# Patient Record
Sex: Male | Born: 1970 | Race: White | Hispanic: No | Marital: Single | Smoking: Current every day smoker
Health system: Southern US, Community
[De-identification: ages and names within clinical notes are randomized; demographics above are authoritative.]

## PROBLEM LIST (undated history)

## (undated) DIAGNOSIS — K219 Gastro-esophageal reflux disease without esophagitis: Secondary | ICD-10-CM

## (undated) DIAGNOSIS — T8859XA Other complications of anesthesia, initial encounter: Secondary | ICD-10-CM

## (undated) DIAGNOSIS — Z8489 Family history of other specified conditions: Secondary | ICD-10-CM

## (undated) DIAGNOSIS — R51 Headache: Secondary | ICD-10-CM

## (undated) DIAGNOSIS — R519 Headache, unspecified: Secondary | ICD-10-CM

## (undated) DIAGNOSIS — T4145XA Adverse effect of unspecified anesthetic, initial encounter: Secondary | ICD-10-CM

## (undated) DIAGNOSIS — T148XXA Other injury of unspecified body region, initial encounter: Secondary | ICD-10-CM

## (undated) HISTORY — PX: WISDOM TOOTH EXTRACTION: SHX21

## (undated) SURGERY — Surgical Case
Anesthesia: *Unknown

---

## 2018-02-05 ENCOUNTER — Emergency Department (HOSPITAL_COMMUNITY): Payer: Self-pay

## 2018-02-05 ENCOUNTER — Emergency Department (HOSPITAL_COMMUNITY)
Admission: EM | Admit: 2018-02-05 | Discharge: 2018-02-05 | Disposition: A | Discharge status: Home / Self Care | Payer: Self-pay | Attending: Emergency Medicine | Admitting: Emergency Medicine

## 2018-02-05 ENCOUNTER — Other Ambulatory Visit: Payer: Self-pay

## 2018-02-05 ENCOUNTER — Encounter (HOSPITAL_COMMUNITY): Payer: Self-pay | Admitting: Emergency Medicine

## 2018-02-05 DIAGNOSIS — F17210 Nicotine dependence, cigarettes, uncomplicated: Secondary | ICD-10-CM | POA: Insufficient documentation

## 2018-02-05 DIAGNOSIS — Y999 Unspecified external cause status: Secondary | ICD-10-CM | POA: Insufficient documentation

## 2018-02-05 DIAGNOSIS — Y939 Activity, unspecified: Secondary | ICD-10-CM | POA: Insufficient documentation

## 2018-02-05 DIAGNOSIS — Z23 Encounter for immunization: Secondary | ICD-10-CM | POA: Insufficient documentation

## 2018-02-05 DIAGNOSIS — Y929 Unspecified place or not applicable: Secondary | ICD-10-CM | POA: Insufficient documentation

## 2018-02-05 DIAGNOSIS — S96921A Laceration of unspecified muscle and tendon at ankle and foot level, right foot, initial encounter: Secondary | ICD-10-CM | POA: Insufficient documentation

## 2018-02-05 DIAGNOSIS — S91011A Laceration without foreign body, right ankle, initial encounter: Secondary | ICD-10-CM

## 2018-02-05 DIAGNOSIS — S96821A Laceration of other specified muscles and tendons at ankle and foot level, right foot, initial encounter: Secondary | ICD-10-CM

## 2018-02-05 DIAGNOSIS — W269XXA Contact with unspecified sharp object(s), initial encounter: Secondary | ICD-10-CM | POA: Insufficient documentation

## 2018-02-05 LAB — BASIC METABOLIC PANEL
Anion gap: 11 (ref 5–15)
BUN: 14 mg/dL (ref 6–20)
CO2: 24 mmol/L (ref 22–32)
Calcium: 8.9 mg/dL (ref 8.9–10.3)
Chloride: 100 mmol/L (ref 98–111)
Creatinine, Ser: 0.92 mg/dL (ref 0.61–1.24)
GFR calc Af Amer: >60 mL/min (ref >60)
GFR calc non Af Amer: >60 mL/min (ref >60)
Glucose, Bld: 95 mg/dL (ref 70–99)
Potassium: 4.3 mmol/L (ref 3.5–5.1)
Sodium: 135 mmol/L (ref 135–145)

## 2018-02-05 LAB — CBC
HCT: 39.8 % (ref 39.0–52.0)
Hemoglobin: 13.3 g/dL (ref 13.0–17.0)
MCH: 33.6 pg (ref 26.0–34.0)
MCHC: 33.4 g/dL (ref 30.0–36.0)
MCV: 100.5 fL — ABNORMAL HIGH (ref 80.0–100.0)
Platelets: 190 10*3/uL (ref 150–400)
RBC: 3.96 MIL/uL — ABNORMAL LOW (ref 4.22–5.81)
RDW: 12.2 % (ref 11.5–15.5)
WBC: 11.4 10*3/uL — ABNORMAL HIGH (ref 4.0–10.5)
nRBC: 0 % (ref 0.0–0.2)

## 2018-02-05 MED ORDER — HYDROMORPHONE HCL 1 MG/ML IJ SOLN
1.0000 mg | Freq: Once | INTRAMUSCULAR | Status: AC
  Administered 2018-02-05: 1 mg via INTRAVENOUS
  Filled 2018-02-05: qty 1

## 2018-02-05 MED ORDER — SODIUM CHLORIDE 0.9 % IV BOLUS
1000.0000 mL | Freq: Once | INTRAVENOUS | Status: AC
  Administered 2018-02-05: 1000 mL via INTRAVENOUS

## 2018-02-05 MED ORDER — LIDOCAINE HCL (PF) 1 % IJ SOLN
10.0000 mL | Freq: Once | INTRAMUSCULAR | Status: AC
  Administered 2018-02-05: 10 mL
  Filled 2018-02-05: qty 10

## 2018-02-05 MED ORDER — TETANUS-DIPHTH-ACELL PERTUSSIS 5-2.5-18.5 LF-MCG/0.5 IM SUSP
0.5000 mL | Freq: Once | INTRAMUSCULAR | Status: AC
  Administered 2018-02-05: 0.5 mL via INTRAMUSCULAR
  Filled 2018-02-05: qty 0.5

## 2018-02-05 MED ORDER — HYDROCODONE-ACETAMINOPHEN 5-325 MG PO TABS: 2.0000 | ORAL_TABLET | ORAL | 0 refills | Status: AC | PRN

## 2018-02-05 MED ORDER — FENTANYL CITRATE (PF) 100 MCG/2ML IJ SOLN
50.0000 ug | Freq: Once | INTRAMUSCULAR | Status: AC
  Administered 2018-02-05: 50 ug via INTRAVENOUS
  Filled 2018-02-05: qty 2

## 2018-02-05 NOTE — Discharge Instructions (Addendum)
PLEASE KEEP POST OP SHOE IN PLACE ALL WEEKEND.

## 2018-02-05 NOTE — ED Notes (Signed)
Patient verbalizes understanding of discharge instructions. Opportunity for questioning and answers were provided. Armband removed by staff, pt discharged from ED home via POV with family. 

## 2018-02-05 NOTE — ED Provider Notes (Signed)
[facility] EMERGENCY DEPARTMENT Provider Note   CSN: [id number] Arrival date & time: 02/05/18  1827     History   Chief Complaint Chief Complaint  Patient presents with  . Foot Injury    HPI Corey Leon is a 47 y.o. male.  Patient is a 47 year old male with past medical history significant for none presenting for laceration to right foot.  Patient states that he was chopping wood and accidentally cut the ax into his right foot.  Patient states that he had some mild numbness the plantar surface of his foot however that is somewhat resolving.  Patient is able to move foot except for extension of his big toe.  The history is provided by the patient, the spouse and the EMS personnel.    History reviewed. No pertinent past medical history.  There are no active problems to display for this patient.   History reviewed. No pertinent surgical history.      Home Medications    Prior to Admission medications   Medication Sig Start Date End Date Taking? Authorizing Provider  acetaminophen (TYLENOL) 325 MG tablet Take 650 mg by mouth every 6 (six) hours as needed for mild pain.   Yes [provider]  naproxen sodium (ALEVE) 220 MG tablet Take 220-440 mg by mouth 2 (two) times daily as needed (pain).   Yes [provider]  HYDROcodone-acetaminophen (NORCO/VICODIN) 5-325 MG tablet Take 2 tablets by mouth every 4 (four) hours as needed for up to 5 days for moderate pain or severe pain (1-moderate pain, 2-severe pain). 02/05/18 02/10/18  Joaquin Courts, MD    Family History No family history on file.  Social History Social History   Tobacco Use  . Smoking status: Current Every Day Smoker    Packs/day: 1.00    Types: Cigarettes  . Smokeless tobacco: Never Used  Substance Use Topics  . Alcohol use: Yes    Comment: social  . Drug use: Never     Allergies   Patient has no known allergies.   Review of Systems Review of Systems    Constitutional: Negative for chills and fever.  HENT: Negative for ear pain and sore throat.   Eyes: Negative for pain and visual disturbance.  Respiratory: Negative for cough and shortness of breath.   Cardiovascular: Negative for chest pain and palpitations.  Gastrointestinal: Negative for abdominal pain and vomiting.  Genitourinary: Negative for dysuria and hematuria.  Musculoskeletal: Negative for arthralgias and back pain.  Skin: Positive for wound. Negative for color change and rash.  Neurological: Negative for seizures and syncope.  All other systems reviewed and are negative.    Physical Exam Updated Vital Signs BP 111/66   Pulse 68   Temp 97.9 F (36.6 C) (Oral)   Resp 18   Ht 5\' 10"  (1.778 m)   Wt 83.9 kg   SpO2 95%   BMI 26.54 kg/m   Physical Exam  Constitutional: He appears well-developed and well-nourished.  HENT:  Head: Normocephalic and atraumatic.  Eyes: Conjunctivae are normal.  Neck: Neck supple.  Cardiovascular: Normal rate and regular rhythm.  No murmur heard. Pulmonary/Chest: Effort normal and breath sounds normal. No respiratory distress.  Abdominal: Soft. There is no tenderness.  Musculoskeletal: He exhibits no edema.       Feet:  Large laceration across anterior of right ankle and dorsum of foot.  Patient has gross sensation intact. DP and PT 2+ pulses.  Patient unable to extend big toe.  Laceration  noted for complete transection of extensor hallucis longus tendon  Neurological: He is alert.  Skin: Skin is warm and dry.  Psychiatric: He has a normal mood and affect.  Nursing note and vitals reviewed.    ED Treatments / Results  Labs (all labs ordered are listed, but only abnormal results are displayed) Labs Reviewed  CBC - Abnormal; Notable for the following components:      Result Value   WBC 11.4 (*)    RBC 3.96 (*)    MCV 100.5 (*)    All other components within normal limits  BASIC METABOLIC PANEL    EKG None  Radiology Dg  Ankle Complete Right  Result Date: 02/05/2018 CLINICAL DATA:  Chopping injury with acts laceration. EXAM: RIGHT ANKLE - COMPLETE 3+ VIEW COMPARISON:  None. FINDINGS: Some soft tissue deformity. Bandage artifact. No evidence of fracture. No air/gas seen in any joint. No discernible radiopaque foreign object. IMPRESSION: Soft tissue injury. No evidence of fracture or discernible radiopaque foreign object. Electronically Signed   By: Paulina Fusi M.D.   On: 02/05/2018 19:46   Dg Foot Complete Right  Result Date: 02/05/2018 CLINICAL DATA:  Ax injury to the soft tissues. EXAM: RIGHT FOOT COMPLETE - 3+ VIEW COMPARISON:  None. FINDINGS: Soft tissue abnormality of the dorsal foot. No evidence of fracture, radiopaque foreign object or discernible air/gas in any joint. IMPRESSION: Soft tissue injury as above. Electronically Signed   By: Paulina Fusi M.D.   On: 02/05/2018 19:46    Procedures .Marland KitchenLaceration Repair Date/Time: 02/05/2018 11:09 PM Performed by: Joaquin Courts, MD Authorized by: Azalia Bilis, MD   Consent:    Consent obtained:  Verbal   Consent given by:  Patient   Alternatives discussed:  Delayed treatment and referral Laceration details:    Location:  Foot   Foot location:  Top of R foot   Length (cm):  20 Repair type:    Repair type:  Simple Pre-procedure details:    Preparation:  Patient was prepped and draped in usual sterile fashion and imaging obtained to evaluate for foreign bodies Exploration:    Wound exploration: wound explored through full range of motion     Wound extent: fascia violated, muscle damage and tendon damage     Wound extent: no underlying fracture noted and no vascular damage noted     Tendon damage location:  Lower extremity   Tendon damage extent:  Complete transection   Tendon repair plan:  Refer for evaluation   Contaminated: no   Treatment:    Area cleansed with:  Betadine and saline   Amount of cleaning:  Extensive   Irrigation solution:  Sterile  saline   Irrigation volume:  2L   Irrigation method:  Syringe   Visualized foreign bodies/material removed: no   Skin repair:    Repair method:  Sutures   Suture size:  3-0   Suture material:  Prolene   Suture technique:  Simple interrupted   Number of sutures:  9 Approximation:    Approximation:  Loose Post-procedure details:    Dressing:  Antibiotic ointment, sterile dressing, splint for protection and bulky dressing   Patient tolerance of procedure:  Tolerated well, no immediate complications   (including critical care time)  Medications Ordered in ED Medications  Tdap (BOOSTRIX) injection 0.5 mL (0.5 mLs Intramuscular Given 02/05/18 1854)  HYDROmorphone (DILAUDID) injection 1 mg (1 mg Intravenous Given 02/05/18 1853)  sodium chloride 0.9 % bolus 1,000 mL (0 mLs Intravenous Stopped  02/05/18 2038)  lidocaine (PF) (XYLOCAINE) 1 % injection 10 mL (10 mLs Infiltration Given 02/05/18 2046)  fentaNYL (SUBLIMAZE) injection 50 mcg (50 mcg Intravenous Given 02/05/18 2046)     Initial Impression / Assessment and Plan / ED Course  I have reviewed the triage vital signs and the nursing notes.  Pertinent labs & imaging results that were available during my care of the patient were reviewed by me and considered in my medical decision making (see chart for details).     Patient is a 47 year old healthy male who presents for a laceration on the dorsal of his left foot concerning for a complete transection of his left extensor hallucis longus tendon. X-ray shows no acute fractures, no foreign bodies.  Case discussed with orthopedic surgeon on-call Dr. Eulah PontMurphy, wound will be washed out and closed.  Patient will be sent home with postop shoe and follow-up in clinic on Monday.  Tetanus was updated. Norco prescription provided for pain control. Wound care was discussed. Patient safe for discharge. All questions were answered.   Final Clinical Impressions(s) / ED Diagnoses   Final diagnoses:    Laceration of extensor tendon of right foot, initial encounter    ED Discharge Orders         Ordered    HYDROcodone-acetaminophen (NORCO/VICODIN) 5-325 MG tablet  Every 4 hours PRN     02/05/18 2205           Joaquin CourtsWendel, Czar Ysaguirre K, MD 02/05/18 2310    Azalia Bilisampos, Kevin, MD 02/06/18 [phone]

## 2018-02-05 NOTE — ED Triage Notes (Signed)
[facility]: Patient to ED from home c/o R foot injury - was cutting wood with axe and accidentally caught his R foot on the swing down - went through his boot - approximately 4 inch laceration with tendon exposure to top of R foot. Bleeding controlled upon EMS arrival (patient applied pressure bandage). He endorses some numbness to R foot but pulses equal bilaterally. EMS VS: 117/76, P 70, 98% RA, RR 16. 18g. PIV LAC, given 100cc [place] and 10mg  morphine en route.

## 2018-02-10 ENCOUNTER — Other Ambulatory Visit: Payer: Self-pay

## 2018-02-10 ENCOUNTER — Encounter (HOSPITAL_COMMUNITY): Payer: Self-pay | Admitting: *Deleted

## 2018-02-10 ENCOUNTER — Other Ambulatory Visit: Payer: Self-pay | Admitting: Orthopaedic Surgery

## 2018-02-10 NOTE — Progress Notes (Signed)
Corey HickmanNicole Leon, Surgical Coordinator, for Dr. Susa SimmondsAdair confirmed that pt significant other, Corey GlatterKim Richardson, is listed as a DPR. Kim completed pt SDW-Pre-op call. Selena BattenKim denies that pt C/O SOB and chest pain. Selena BattenKim denies that pt is under the care of a cardiologist. Selena BattenKim denies that pt had a stress test, echo and cardiac cath. Kim made aware to have pt stop taking Aspirin (does not take per Selena BattenKim), vitamins, fish oil and herbal medications. Do not take any NSAIDs ie: Ibuprofen, Advil, Naproxen (Aleve), Motrin, BC and Goody Powder. Kim verbalized understanding of all pre-op instructions.

## 2018-02-11 ENCOUNTER — Ambulatory Visit (HOSPITAL_COMMUNITY): Payer: Self-pay | Admitting: Certified Registered Nurse Anesthetist

## 2018-02-11 ENCOUNTER — Encounter (HOSPITAL_COMMUNITY): Payer: Self-pay

## 2018-02-11 ENCOUNTER — Ambulatory Visit (HOSPITAL_COMMUNITY)
Admission: RE | Admit: 2018-02-11 | Discharge: 2018-02-11 | Disposition: A | Discharge status: Home / Self Care | Payer: Self-pay | Attending: Orthopaedic Surgery | Admitting: Orthopaedic Surgery

## 2018-02-11 ENCOUNTER — Encounter (HOSPITAL_COMMUNITY)
Admission: RE | Disposition: A | Discharge status: Home / Self Care | Payer: Self-pay | Source: Home / Self Care | Attending: Orthopaedic Surgery

## 2018-02-11 DIAGNOSIS — S92141B Displaced dome fracture of right talus, initial encounter for open fracture: Secondary | ICD-10-CM | POA: Insufficient documentation

## 2018-02-11 DIAGNOSIS — S96121A Laceration of muscle and tendon of long extensor muscle of toe at ankle and foot level, right foot, initial encounter: Secondary | ICD-10-CM | POA: Insufficient documentation

## 2018-02-11 DIAGNOSIS — Y929 Unspecified place or not applicable: Secondary | ICD-10-CM | POA: Insufficient documentation

## 2018-02-11 DIAGNOSIS — Z791 Long term (current) use of non-steroidal anti-inflammatories (NSAID): Secondary | ICD-10-CM | POA: Insufficient documentation

## 2018-02-11 DIAGNOSIS — W270XXA Contact with workbench tool, initial encounter: Secondary | ICD-10-CM | POA: Insufficient documentation

## 2018-02-11 DIAGNOSIS — F17210 Nicotine dependence, cigarettes, uncomplicated: Secondary | ICD-10-CM | POA: Insufficient documentation

## 2018-02-11 DIAGNOSIS — K219 Gastro-esophageal reflux disease without esophagitis: Secondary | ICD-10-CM | POA: Insufficient documentation

## 2018-02-11 HISTORY — PX: WOUND EXPLORATION: SHX6188

## 2018-02-11 HISTORY — DX: Family history of other specified conditions: Z84.89

## 2018-02-11 HISTORY — DX: Other injury of unspecified body region, initial encounter: T14.8XXA

## 2018-02-11 HISTORY — PX: TENDON REPAIR: SHX5111

## 2018-02-11 HISTORY — DX: Headache, unspecified: R51.9

## 2018-02-11 HISTORY — DX: Headache: R51

## 2018-02-11 HISTORY — DX: Other complications of anesthesia, initial encounter: T88.59XA

## 2018-02-11 HISTORY — DX: Gastro-esophageal reflux disease without esophagitis: K21.9

## 2018-02-11 HISTORY — PX: INCISION AND DRAINAGE OF WOUND: SHX1803

## 2018-02-11 HISTORY — DX: Adverse effect of unspecified anesthetic, initial encounter: T41.45XA

## 2018-02-11 SURGERY — WOUND EXPLORATION
Anesthesia: General | Site: Leg Lower | Laterality: Right

## 2018-02-11 MED ORDER — HYDROMORPHONE HCL 1 MG/ML IJ SOLN
INTRAMUSCULAR | Status: AC
  Administered 2018-02-11: 0.5 mg via INTRAVENOUS
  Filled 2018-02-11: qty 1

## 2018-02-11 MED ORDER — PROMETHAZINE HCL 25 MG/ML IJ SOLN: 6.2500 mg | INTRAMUSCULAR | Status: DC | PRN

## 2018-02-11 MED ORDER — CEFAZOLIN SODIUM-DEXTROSE 2-3 GM-%(50ML) IV SOLR
INTRAVENOUS | Status: DC | PRN
  Administered 2018-02-11: 2 g via INTRAVENOUS

## 2018-02-11 MED ORDER — MIDAZOLAM HCL 2 MG/2ML IJ SOLN
INTRAMUSCULAR | Status: AC
  Filled 2018-02-11: qty 2

## 2018-02-11 MED ORDER — OXYCODONE HCL 5 MG PO TABS
ORAL_TABLET | ORAL | Status: AC
  Filled 2018-02-11: qty 1

## 2018-02-11 MED ORDER — FENTANYL CITRATE (PF) 100 MCG/2ML IJ SOLN
INTRAMUSCULAR | Status: DC | PRN
  Administered 2018-02-11 (×5): 50 ug via INTRAVENOUS

## 2018-02-11 MED ORDER — PROPOFOL 10 MG/ML IV BOLUS
INTRAVENOUS | Status: AC
  Filled 2018-02-11: qty 20

## 2018-02-11 MED ORDER — HYDROMORPHONE HCL 1 MG/ML IJ SOLN
0.2500 mg | INTRAMUSCULAR | Status: DC | PRN
  Administered 2018-02-11 (×4): 0.5 mg via INTRAVENOUS

## 2018-02-11 MED ORDER — PHENYLEPHRINE HCL 10 MG/ML IJ SOLN
INTRAMUSCULAR | Status: DC | PRN
  Administered 2018-02-11: 80 ug via INTRAVENOUS

## 2018-02-11 MED ORDER — DEXAMETHASONE SODIUM PHOSPHATE 10 MG/ML IJ SOLN
INTRAMUSCULAR | Status: AC
  Filled 2018-02-11: qty 1

## 2018-02-11 MED ORDER — BUPIVACAINE HCL 0.25 % IJ SOLN
INTRAMUSCULAR | Status: DC | PRN
  Administered 2018-02-11: 20 mL

## 2018-02-11 MED ORDER — OXYCODONE HCL 5 MG/5ML PO SOLN: 5.0000 mg | Freq: Once | ORAL | Status: AC | PRN

## 2018-02-11 MED ORDER — PHENYLEPHRINE 40 MCG/ML (10ML) SYRINGE FOR IV PUSH (FOR BLOOD PRESSURE SUPPORT)
PREFILLED_SYRINGE | INTRAVENOUS | Status: AC
  Filled 2018-02-11: qty 20

## 2018-02-11 MED ORDER — MIDAZOLAM HCL 5 MG/5ML IJ SOLN
INTRAMUSCULAR | Status: DC | PRN
  Administered 2018-02-11: 2 mg via INTRAVENOUS

## 2018-02-11 MED ORDER — SODIUM CHLORIDE 0.9 % IR SOLN
Status: DC | PRN
  Administered 2018-02-11 (×2): 3000 mL

## 2018-02-11 MED ORDER — ARTIFICIAL TEARS OPHTHALMIC OINT
TOPICAL_OINTMENT | OPHTHALMIC | Status: AC
  Filled 2018-02-11: qty 3.5

## 2018-02-11 MED ORDER — LIDOCAINE HCL (CARDIAC) PF 100 MG/5ML IV SOSY
PREFILLED_SYRINGE | INTRAVENOUS | Status: DC | PRN
  Administered 2018-02-11: 80 mg via INTRAVENOUS

## 2018-02-11 MED ORDER — OXYCODONE HCL 5 MG PO TABS
5.0000 mg | ORAL_TABLET | Freq: Once | ORAL | Status: AC | PRN
  Administered 2018-02-11: 5 mg via ORAL

## 2018-02-11 MED ORDER — ONDANSETRON HCL 4 MG/2ML IJ SOLN
INTRAMUSCULAR | Status: DC | PRN
  Administered 2018-02-11: 4 mg via INTRAVENOUS

## 2018-02-11 MED ORDER — ONDANSETRON HCL 4 MG/2ML IJ SOLN
INTRAMUSCULAR | Status: AC
  Filled 2018-02-11: qty 2

## 2018-02-11 MED ORDER — BUPIVACAINE HCL (PF) 0.5 % IJ SOLN
INTRAMUSCULAR | Status: AC
  Filled 2018-02-11: qty 30

## 2018-02-11 MED ORDER — LIDOCAINE 2% (20 MG/ML) 5 ML SYRINGE
INTRAMUSCULAR | Status: AC
  Filled 2018-02-11: qty 10

## 2018-02-11 MED ORDER — LACTATED RINGERS IV SOLN
INTRAVENOUS | Status: DC
  Administered 2018-02-11 (×2): via INTRAVENOUS

## 2018-02-11 MED ORDER — FENTANYL CITRATE (PF) 250 MCG/5ML IJ SOLN
INTRAMUSCULAR | Status: AC
  Filled 2018-02-11: qty 5

## 2018-02-11 MED ORDER — PROPOFOL 10 MG/ML IV BOLUS
INTRAVENOUS | Status: DC | PRN
  Administered 2018-02-11: 200 mg via INTRAVENOUS

## 2018-02-11 MED ORDER — SODIUM CHLORIDE 0.9 % IR SOLN
Status: DC | PRN
  Administered 2018-02-11: 1

## 2018-02-11 SURGICAL SUPPLY — 55 items
BANDAGE ACE 4X5 VEL STRL LF (GAUZE/BANDAGES/DRESSINGS) IMPLANT
BANDAGE ACE 6X5 VEL STRL LF (GAUZE/BANDAGES/DRESSINGS) IMPLANT
BANDAGE ELASTIC 4 VELCRO ST LF (GAUZE/BANDAGES/DRESSINGS) ×3 IMPLANT
BANDAGE ELASTIC 6 VELCRO ST LF (GAUZE/BANDAGES/DRESSINGS) ×3 IMPLANT
BNDG GAUZE ELAST 4 BULKY (GAUZE/BANDAGES/DRESSINGS) IMPLANT
CONT SPEC 4OZ CLIKSEAL STRL BL (MISCELLANEOUS) IMPLANT
COVER SURGICAL LIGHT HANDLE (MISCELLANEOUS) ×3 IMPLANT
COVER WAND RF STERILE (DRAPES) ×3 IMPLANT
CUFF TOURN SGL LL 12 NO SLV (MISCELLANEOUS) IMPLANT
CUFF TOURNIQUET SINGLE 34IN LL (TOURNIQUET CUFF) IMPLANT
DRAPE U-SHAPE 47X51 STRL (DRAPES) ×3 IMPLANT
DRSG PAD ABDOMINAL 8X10 ST (GAUZE/BANDAGES/DRESSINGS) IMPLANT
DURAPREP 26ML APPLICATOR (WOUND CARE) IMPLANT
ELECT REM PT RETURN 9FT ADLT (ELECTROSURGICAL) ×3
ELECTRODE REM PT RTRN 9FT ADLT (ELECTROSURGICAL) ×1 IMPLANT
GAUZE SPONGE 4X4 12PLY STRL (GAUZE/BANDAGES/DRESSINGS) IMPLANT
GAUZE SPONGE 4X4 12PLY STRL LF (GAUZE/BANDAGES/DRESSINGS) ×3 IMPLANT
GAUZE XEROFORM 1X8 LF (GAUZE/BANDAGES/DRESSINGS) ×3 IMPLANT
GLOVE BIOGEL PI IND STRL 8 (GLOVE) ×2 IMPLANT
GLOVE BIOGEL PI INDICATOR 8 (GLOVE) ×4
GLOVE ECLIPSE 8.0 STRL XLNG CF (GLOVE) IMPLANT
GOWN STRL REUS W/ TWL LRG LVL3 (GOWN DISPOSABLE) ×1 IMPLANT
GOWN STRL REUS W/ TWL XL LVL3 (GOWN DISPOSABLE) ×1 IMPLANT
GOWN STRL REUS W/TWL LRG LVL3 (GOWN DISPOSABLE) ×2
GOWN STRL REUS W/TWL XL LVL3 (GOWN DISPOSABLE) ×2
HANDPIECE INTERPULSE COAX TIP (DISPOSABLE)
IRRIGAT CYSTOTOME (MISCELLANEOUS) ×3 IMPLANT
KIT BASIN OR (CUSTOM PROCEDURE TRAY) ×3 IMPLANT
KIT TURNOVER KIT B (KITS) ×3 IMPLANT
MANIFOLD NEPTUNE II (INSTRUMENTS) ×3 IMPLANT
NEEDLE HYPO 25GX1X1/2 BEV (NEEDLE) ×3 IMPLANT
NS IRRIG 1000ML POUR BTL (IV SOLUTION) ×3 IMPLANT
PACK ORTHO EXTREMITY (CUSTOM PROCEDURE TRAY) ×3 IMPLANT
PAD ARMBOARD 7.5X6 YLW CONV (MISCELLANEOUS) ×3 IMPLANT
PAD CAST 4YDX4 CTTN HI CHSV (CAST SUPPLIES) ×1 IMPLANT
PADDING CAST COTTON 4X4 STRL (CAST SUPPLIES) ×2
PADDING CAST COTTON 6X4 STRL (CAST SUPPLIES) IMPLANT
SET HNDPC FAN SPRY TIP SCT (DISPOSABLE) IMPLANT
SOLUTION BETADINE 4OZ (MISCELLANEOUS) ×3 IMPLANT
SPONGE LAP 18X18 X RAY DECT (DISPOSABLE) IMPLANT
SUT 0 FIBERLOOP 38 BLUE TPR ND (SUTURE) ×3
SUT ETHILON 2 0 FS 18 (SUTURE) IMPLANT
SUT ETHILON 3 0 PS 1 (SUTURE) ×3 IMPLANT
SUT MON AB 3-0 SH 27 (SUTURE) ×2
SUT MON AB 3-0 SH27 (SUTURE) ×1 IMPLANT
SUT PDS AB 2-0 CT1 27 (SUTURE) ×3 IMPLANT
SUTURE 0 FIBERLP 38 BLU TPR ND (SUTURE) ×1 IMPLANT
SWAB CULTURE ESWAB REG 1ML (MISCELLANEOUS) IMPLANT
SYR CONTROL 10ML LL (SYRINGE) ×3 IMPLANT
TOWEL OR 17X24 6PK STRL BLUE (TOWEL DISPOSABLE) IMPLANT
TOWEL OR 17X26 10 PK STRL BLUE (TOWEL DISPOSABLE) ×3 IMPLANT
TUBE CONNECTING 12'X1/4 (SUCTIONS) ×1
TUBE CONNECTING 12X1/4 (SUCTIONS) ×2 IMPLANT
UNDERPAD 30X30 (UNDERPADS AND DIAPERS) ×3 IMPLANT
YANKAUER SUCT BULB TIP NO VENT (SUCTIONS) ×3 IMPLANT

## 2018-02-11 NOTE — Discharge Instructions (Signed)
DR. Susa SimmondsADAIR [facility] POST-OP INSTRUCTIONS   Pain Management   1. Keep your foot elevated above heart level.  Make sure that your heel hangs free ('floats'). 2. Keep boot in place at all times.  3. Take all prescribed medication as directed. 4. If taking narcotic pain medication you may want to use an over-the-counter stool softener to avoid constipation. 5. You may take over-the-counter NSAIDs (ibuprofen, naproxen, etc.) as well as over-the-counter acetaminophen as directed on the packaging as a supplement for your pain and may also use it to wean away from the prescription medication.  Activity  ? Non-weightbearing   First Postoperative Visit 1. Your first postop visit will be at least 2 weeks after surgery.  This should be scheduled when you schedule surgery. 2. If you do not have a postoperative visit scheduled please call [phone] to schedule an appointment. 3. At the appointment your incision will be evaluated for suture removal, x-rays will be obtained if necessary.  General Instructions 1. Swelling is very common after foot and ankle surgery.  It often takes 3 months for the foot and ankle to begin to feel comfortable.  Some amount of swelling will persist for 6-12 months. 2. DO NOT change the dressing.  If there is a problem with the dressing (too tight, loose, gets wet, etc.) please contact Dr. Donnie MesaAdair's office. 3. DO NOT get the dressing wet.  For showers you can use an over-the-counter cast cover or wrap a washcloth around the top of your dressing and then cover it with a plastic bag and tape it to your leg. 4. DO NOT soak the incision (no tubs, pools, bath, etc.) until you have approval from Dr. Susa SimmondsAdair.  Contact Dr. Garret ReddishAdairs office or go to Emergency Room if: 1. Temperature above 101 F. 2. Increasing pain that is unresponsive to pain medication or elevation 3. Excessive redness or swelling in your foot 4. Dressing problems - excessive bloody drainage, looseness  or tightness, or if dressing gets wet 5. Develop pain, swelling, warmth, or discoloration of your calf

## 2018-02-11 NOTE — Anesthesia Preprocedure Evaluation (Signed)
Anesthesia Evaluation  Patient identified by MRN, date of birth, ID band Patient awake    Reviewed: Allergy & Precautions, NPO status , Patient's Chart, lab work & pertinent test results  Airway Mallampati: II  TM Distance: >3 FB Neck ROM: Full    Dental no notable dental hx.    Pulmonary Current Smoker,    Pulmonary exam normal breath sounds clear to auscultation       Cardiovascular negative cardio ROS Normal cardiovascular exam Rhythm:Regular Rate:Normal     Neuro/Psych  Headaches, negative psych ROS   GI/Hepatic negative GI ROS, Neg liver ROS,   Endo/Other  negative endocrine ROS  Renal/GU negative Renal ROS     Musculoskeletal negative musculoskeletal ROS (+)   Abdominal   Peds  Hematology negative hematology ROS (+)   Anesthesia Other Findings  RIGHT FOOT TRAUMATIC EXTENSOR HALLUCUS LONGUS LACERATION  Reproductive/Obstetrics                             Anesthesia Physical Anesthesia Plan  ASA: II  Anesthesia Plan: General   Post-op Pain Management:    Induction: Intravenous  PONV Risk Score and Plan: 1 and Ondansetron, Dexamethasone, Midazolam and Treatment may vary due to age or medical condition  Airway Management Planned: LMA  Additional Equipment:   Intra-op Plan:   Post-operative Plan: Extubation in OR  Informed Consent: I have reviewed the patients History and Physical, chart, labs and discussed the procedure including the risks, benefits and alternatives for the proposed anesthesia with the patient or authorized representative who has indicated his/her understanding and acceptance.   Dental advisory given  Plan Discussed with: CRNA  Anesthesia Plan Comments:         Anesthesia Quick Evaluation

## 2018-02-11 NOTE — Anesthesia Postprocedure Evaluation (Signed)
Anesthesia Post Note  Patient: Corey Leon  Procedure(s) Performed: RIGHT FOOT WOUND EXPLORATION (Right Leg Lower) TENDON REPAIR (Right Leg Lower) IRRIGATION AND DEBRIDEMENT WOUND; IRRIGATION AND DEBRIDEMENT OF SITE OPEN FRACTURE; IRRIGATION AND DEBRIDEMENT OF ANKLE ARTHROTOMY; SECONDARY WOUND CLOSURE 7.5 CENTIMETERS; REPAIR OF EXTENSOR RETINACULUM (Right Leg Lower)     Patient location during evaluation: PACU Anesthesia Type: General Level of consciousness: awake and alert Pain management: pain level controlled Vital Signs Assessment: post-procedure vital signs reviewed and stable Respiratory status: spontaneous breathing, nonlabored ventilation, respiratory function stable and patient connected to nasal cannula oxygen Cardiovascular status: blood pressure returned to baseline and stable Postop Assessment: no apparent nausea or vomiting Anesthetic complications: no    Last Vitals:  Vitals:   02/11/18 1400 02/11/18 1412  BP: (!) 124/98 (!) 143/88  Pulse: 62 (!) 59  Resp: 17   Temp: 36.6 C   SpO2: 99% 99%    Last Pain:  Vitals:   02/11/18 1400  TempSrc:   PainSc: 4                  Ryan P Ellender

## 2018-02-11 NOTE — H&P (Signed)
Corey FreshwaterVincent Leon is an 47 y.o. male.   Chief Complaint: Right anterior ankle laceration and extensor pollicis longus laceration HPI: Corey DoneVincent is a [age] year old male who sustained a ax injury to his right anterior ankle.  This resulted in an EHL laceration and large traumatic wound.  He was seen in my clinic after the wound was irrigated and closed in the emergency department.  He was indicated for wound exploration and debridement with repair of damaged structures.  We will plan for repair of EHL tendon and other structures that are damaged.  Patient does not have significant pain.  He notes some decreased sensation between the first and second toes but otherwise is doing well.  Past Medical History:  Diagnosis Date  . Complication of anesthesia   . Family history of adverse reaction to anesthesia   . GERD (gastroesophageal reflux disease)   . Headache   . Laceration involving tendon    right extensor tendon    Past Surgical History:  Procedure Laterality Date  . WISDOM TOOTH EXTRACTION      Family History  Problem Relation Age of Onset  . Lung cancer Mother    Social History:  reports that he has been smoking cigarettes. He has been smoking about 1.00 pack per day. He has never used smokeless tobacco. He reports current alcohol use. He reports that he does not use drugs.  Allergies: No Known Allergies  Medications Prior to Admission  Medication Sig Dispense Refill  . acetaminophen (TYLENOL) 325 MG tablet Take 650 mg by mouth every 6 (six) hours as needed for mild pain.    . cephALEXin (KEFLEX) 500 MG capsule Take 500 mg by mouth 4 (four) times daily.    Marland Kitchen. ibuprofen (ADVIL,MOTRIN) 800 MG tablet Take 800 mg by mouth every 8 (eight) hours as needed.    . naproxen sodium (ALEVE) 220 MG tablet Take 220-440 mg by mouth 2 (two) times daily as needed (pain).      No results found for this or any previous visit (from the past 48 hour(s)). No results found.  Review of Systems   Constitutional: Negative.   HENT: Negative.   Eyes: Negative.   Respiratory: Negative.   Cardiovascular: Negative.   Gastrointestinal: Negative.   Musculoskeletal:       Right anterior ankle pain  Skin:       Right foot laceration  Neurological: Negative.   Psychiatric/Behavioral: Negative.     Blood pressure 109/70, pulse 77, temperature 98.3 F (36.8 C), temperature source Oral, resp. rate 18, height 5\' 10"  (1.778 m), weight 83.9 kg, SpO2 100 %. Physical Exam  Constitutional: He is oriented to person, place, and time. He appears well-developed.  HENT:  Head: Normocephalic.  Eyes: Conjunctivae are normal.  Neck: Neck supple.  Cardiovascular: Normal rate.  Respiratory: Effort normal.  GI: Soft.  Musculoskeletal:     Comments: Right anterior ankle pain.  There is a dressing in place.  Hallux is drooping at the IP joint and MTP joint.  Decreased sensation in the deep peroneal nerve distribution.  And intact sensation superficial peroneal nerve distribution as well as the medial hallux.  Foot is warm and well-perfused with good capillary refill of the toes.  Neurological: He is alert and oriented to person, place, and time.  Skin: Skin is warm and dry.  Psychiatric: He has a normal mood and affect.     Assessment/Plan We will plan for right ankle wound exploration with irrigation and debridement.  Will repair EHL tendon  and other damage structures.  Foot is well-perfused at this point but will inspect the deep peroneal nerve and the anterior tibial artery.  Postoperatively he will be placed in a walking boot that he will not remove.  He will be nonweightbearing initially.  We discussed the risk, benefits and alternatives to the surgery in the clinic visit.  This is documented well.  Risks discussed included but were not limited to wound healing complications and infection due to the traumatic nature of this wound.  Decreased hallux motion.  Decreased hallux extension strength.   Persistent numbness between the first and second digits.  This is all due to the initial injury.  After weighing these risks he would like to proceed with surgery.  Terance Hart, MD 02/11/2018, 11:32 AM

## 2018-02-11 NOTE — Op Note (Signed)
Corey Leon male 47 y.o. 02/11/2018  PreOperative Diagnosis: Right anterior ankle traumatic axe wound - 7.5 cm Extensor hallucis longus, right laceration.  PostOperative Diagnosis: Right open talus fracture Traumatic right ankle arthrotomy Right anterior ankle axe wound -7.5 cm Extensor pollicis longus laceration    Procedure(s) and Anesthesia Type: RIGHT FOOT WOUND EXPLORATION OPEN TREATMENT OF TALUS FRACTURE WITHOUT INTERNAL FIXATION -  - Choice EHL TENDON REPAIR - ChoicE FOREIGN BODY REMOVAL - IRRIGATION AND DEBRIDEMENT OF SITE OPEN FRACTURE WITH DEBRIDEMENT OF BONE, SOFT TISSUE; ANKLE ARTHROTOMY WITH DEBRIDEMENT OF TRAUMATIC ARTHROTOMY SECONDARY WOUND CLOSURE 7.5 CENTIMETERS;     Surgeon: Terance Harthristopher R Aletta Edmunds   Assistants: None  Anesthesia: General LMA anesthesia  Findings: Open lateral anterior dome of the talus fracture EHL laceration Neurovascular bundle intact Traumatic ankle arthrotomy  Implants: None  Indications:47 y.o. male who sustained an anterior ankle wound while chopping wood on the right ankle.  He was seen in the emergency department where the wound was irrigated.  There was evidence of EHL laceration.  The wound was closed in the ER loosely.  He was sent to my office for follow-up.  He was not initially placed on antibiotics.  On evaluation in the clinic he was found to have a droopy hallux and large anterior ankle wound.  He was indicated for wound exploration and debridement and repair of his EHL tendon.  We also discussed repairing any other damage structures at the time of surgery.  We discussed the risks of the surgery which included but were not limited to wound healing complications, infection, need for second surgery, continued pain, less than optimal outcome.  The patient is a smoker and therefore understood his increased risk and accepted these risks and decided to proceed with surgery.  Procedure Detail: The patient was identified in the  preoperative holding area.  The right lower extremity was marked by myself as the appropriate operative extremity.  The consent was signed by myself and the patient.  The patient was taken to the operative suite placed supine on operative table.  All bony prominences were well-padded and a bump was placed under the right hip.  General LMA anesthesia was induced without difficulty.  Preoperative antibiotics were given.  Surgical timeout was performed.  The right lower extremity was prepped and draped in the usual sterile fashion.  No tourniquet was placed.  I began by removing the sutures placed in the emergency department.  Once these were removed using blunt dissection I opened up the traumatic wound.  Upon initial entry there was a large rush of hematoma and blood.  The wound extended down through the extensor retinaculum there was visible distal aspect of the EHL laceration.  The wound was continued down and there is found to be a fracture of the anterior portion of the lateral talar dome.  This was minimally displaced.  There is also evidence of violation of the anterior ankle capsule with exposed ankle joint.  There is some evidence of fibrous tissue within the wound from the time of injury.  The wound was then copiously irrigated.  This was to remove the hematoma and foreign bodies.  The wound was then reinspected.  The extensor hallicis longus tendon was in fact completely lacerated and the proximal portion was retracted.  The extensor digitorum tendons were intact.  I extended the EHL sheath proximally and looked underneath this and found the neurovascular bundle was intact without evidence of significant injury.  There was some evidence of bruising and  hematoma around this but the dorsalis pedis pulse was in fact palpable and bounding.  The deep peroneal nerve was intact.  There was no evidence of anterior tibialis tendon injury.  Then we reirrigated the ankle joint removing any evidence of foreign bodies  and bony and cartilaginous fragments.  The fracture site was further inspected and curetted.  The fracture was minimally displaced and appeared to be in a stable position.  We decided not to fix it with any hardware given the traumatic nature of the wound and the stability of the fracture.  Then the extensor hallucis longus sheath was opened up further proximally after extending the incision 2 cm sharply.  Then the proximal aspect of the EHL tendon was retrieved and tagged with a 0 FiberWire stitch.  This was done in a [place] fashion.  Then the distal aspect of the tendon was also tagged with a 0 FiberWire stitch in a Krakw fashion.  That after inspection of the wound was performed and found to be without evidence of foreign body or contamination the ankle capsule was closed with 2-0 PDS suture.  Then the deeper tissues were closed with 2-0 PDS as well.  The EHL tendon was repaired with 0 FiberWire in a end-to-end fashion overlapping slightly to obtain good tension.  This was done with 0 FiberWire stitch.  Then the extensor retinaculum was closed over the top of the tendon and it was found to glide well underneath.  Then the subcuticular tissue was closed with a 3-0 Monocryl and the skin with a 3-0 nylon stitch.  The placement was placed in a soft dressing of Xeroform 4 x 4's cast padding and an Ace wrap and then he was placed into a tall walking boot in a neutral position.  The counts were correct at the end of the case.  There were no complications.  He is awaken from anesthesia and taken to the recovery room in stable conditions.  Post Op Instructions: Nonweightbearing right lower extremity Keep the boot in place at all times He will continue his Keflex that was given to him at the time of initial visit in my clinic We will see him back in 2 weeks for wound check and possible suture removal Call the office with concerns  Tourniquette Time: No tourniquet was used  Estimated Blood Loss:  less than 50  mL         Drains: none  Blood Given: none         Specimens: none       Complications:  * No complications entered in OR log *         Disposition: PACU - hemodynamically stable.         Condition: stable

## 2018-02-11 NOTE — Transfer of Care (Signed)
Immediate Anesthesia Transfer of Care Note  Patient: Ronnell FreshwaterVincent Schembri  Procedure(s) Performed: RIGHT FOOT WOUND EXPLORATION (Right Leg Lower) TENDON REPAIR (Right Leg Lower) IRRIGATION AND DEBRIDEMENT WOUND; IRRIGATION AND DEBRIDEMENT OF SITE OPEN FRACTURE; IRRIGATION AND DEBRIDEMENT OF ANKLE ARTHROTOMY; SECONDARY WOUND CLOSURE 7.5 CENTIMETERS; REPAIR OF EXTENSOR RETINACULUM (Right Leg Lower)  Patient Location: PACU  Anesthesia Type:General  Level of Consciousness: awake, alert  and oriented  Airway & Oxygen Therapy: Patient Spontanous Breathing and Patient connected to nasal cannula oxygen  Post-op Assessment: Report given to RN, Post -op Vital signs reviewed and stable and Patient moving all extremities  Post vital signs: Reviewed and stable  Last Vitals:  Vitals Value Taken Time  BP 142/80 02/11/2018  1:16 PM  Temp 36.3 C 02/11/2018  1:15 PM  Pulse 85 02/11/2018  1:24 PM  Resp 19 02/11/2018  1:24 PM  SpO2 100 % 02/11/2018  1:24 PM  Vitals shown include unvalidated device data.  Last Pain:  Vitals:   02/11/18 1315  TempSrc:   PainSc: 7       Patients Stated Pain Goal: 4 (02/11/18 1315)  Complications: No apparent anesthesia complications

## 2018-02-11 NOTE — Anesthesia Procedure Notes (Signed)
Procedure Name: LMA Insertion Date/Time: 02/11/2018 12:05 PM Performed by: Quayshaun Hubbert T, CRNA Pre-anesthesia Checklist: Patient identified, Emergency Drugs available, Suction available and Patient being monitored Patient Re-evaluated:Patient Re-evaluated prior to induction Oxygen Delivery Method: Circle system utilized Preoxygenation: Pre-oxygenation with 100% oxygen Induction Type: IV induction Ventilation: Mask ventilation without difficulty LMA: LMA inserted LMA Size: 5.0 Number of attempts: 1 Airway Equipment and Method: Patient positioned with wedge pillow Placement Confirmation: positive ETCO2 and breath sounds checked- equal and bilateral Tube secured with: Tape Dental Injury: Teeth and Oropharynx as per pre-operative assessment

## 2018-02-12 ENCOUNTER — Encounter (HOSPITAL_COMMUNITY): Payer: Self-pay | Admitting: Orthopaedic Surgery

## 2019-05-30 IMAGING — DX DG ANKLE COMPLETE 3+V*R*
3 series · 3 of 3 positions shown · non-contrast
Comparison: None.

CLINICAL DATA: Chopping injury with acts laceration.

EXAM:
RIGHT ANKLE - COMPLETE 3+ VIEW

[ankle ap]
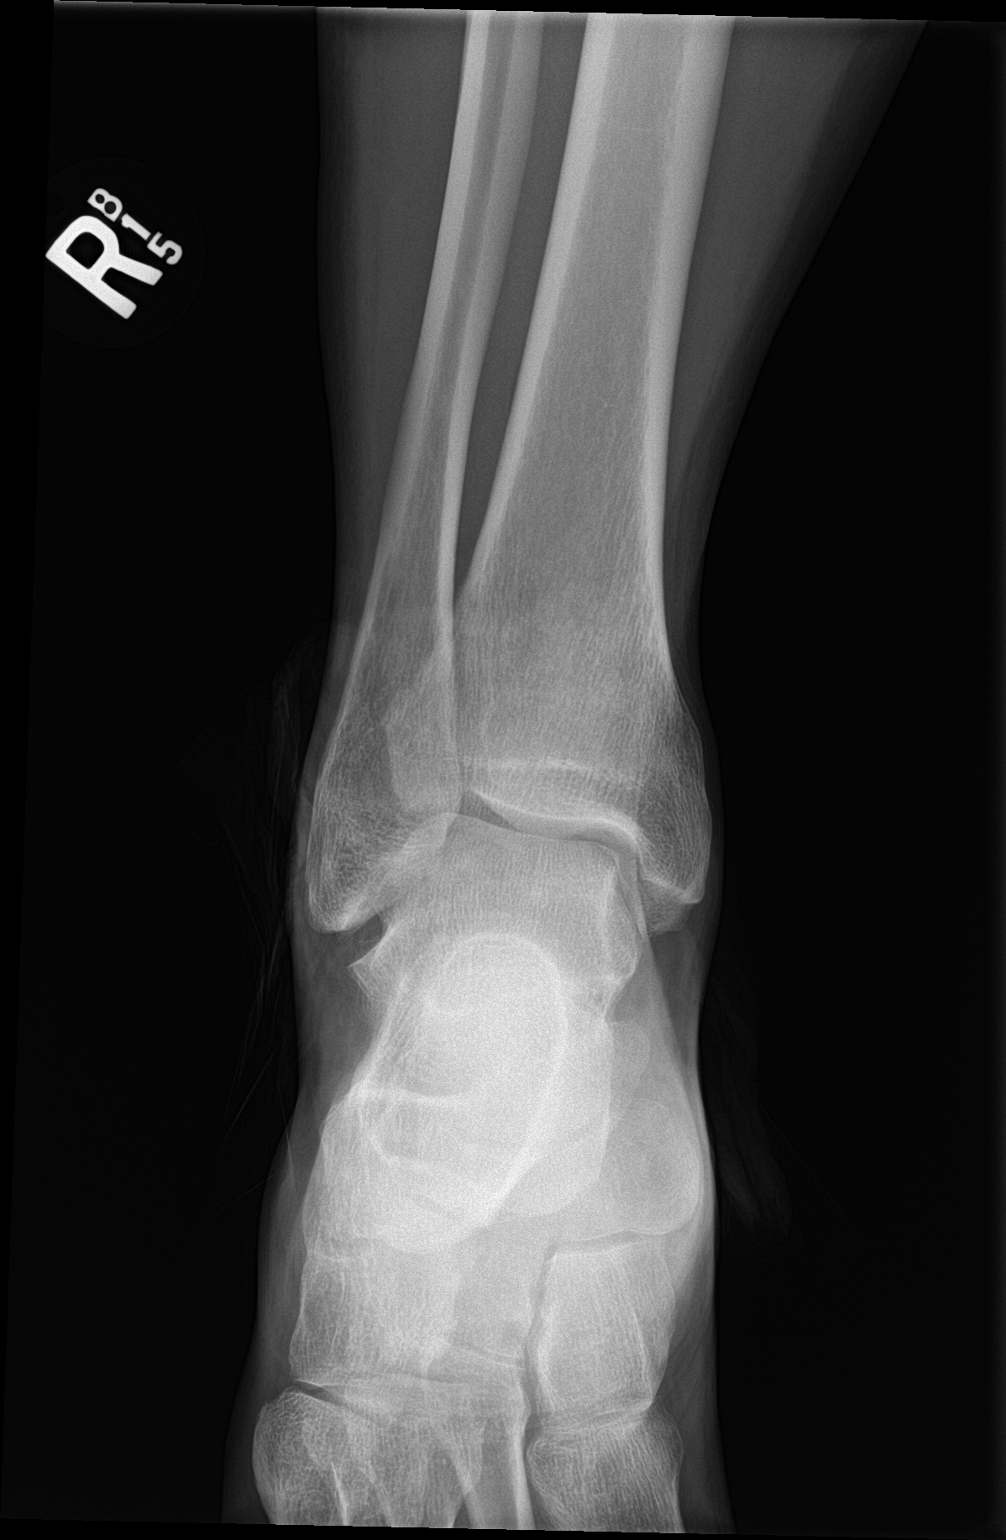

[ankle obl]
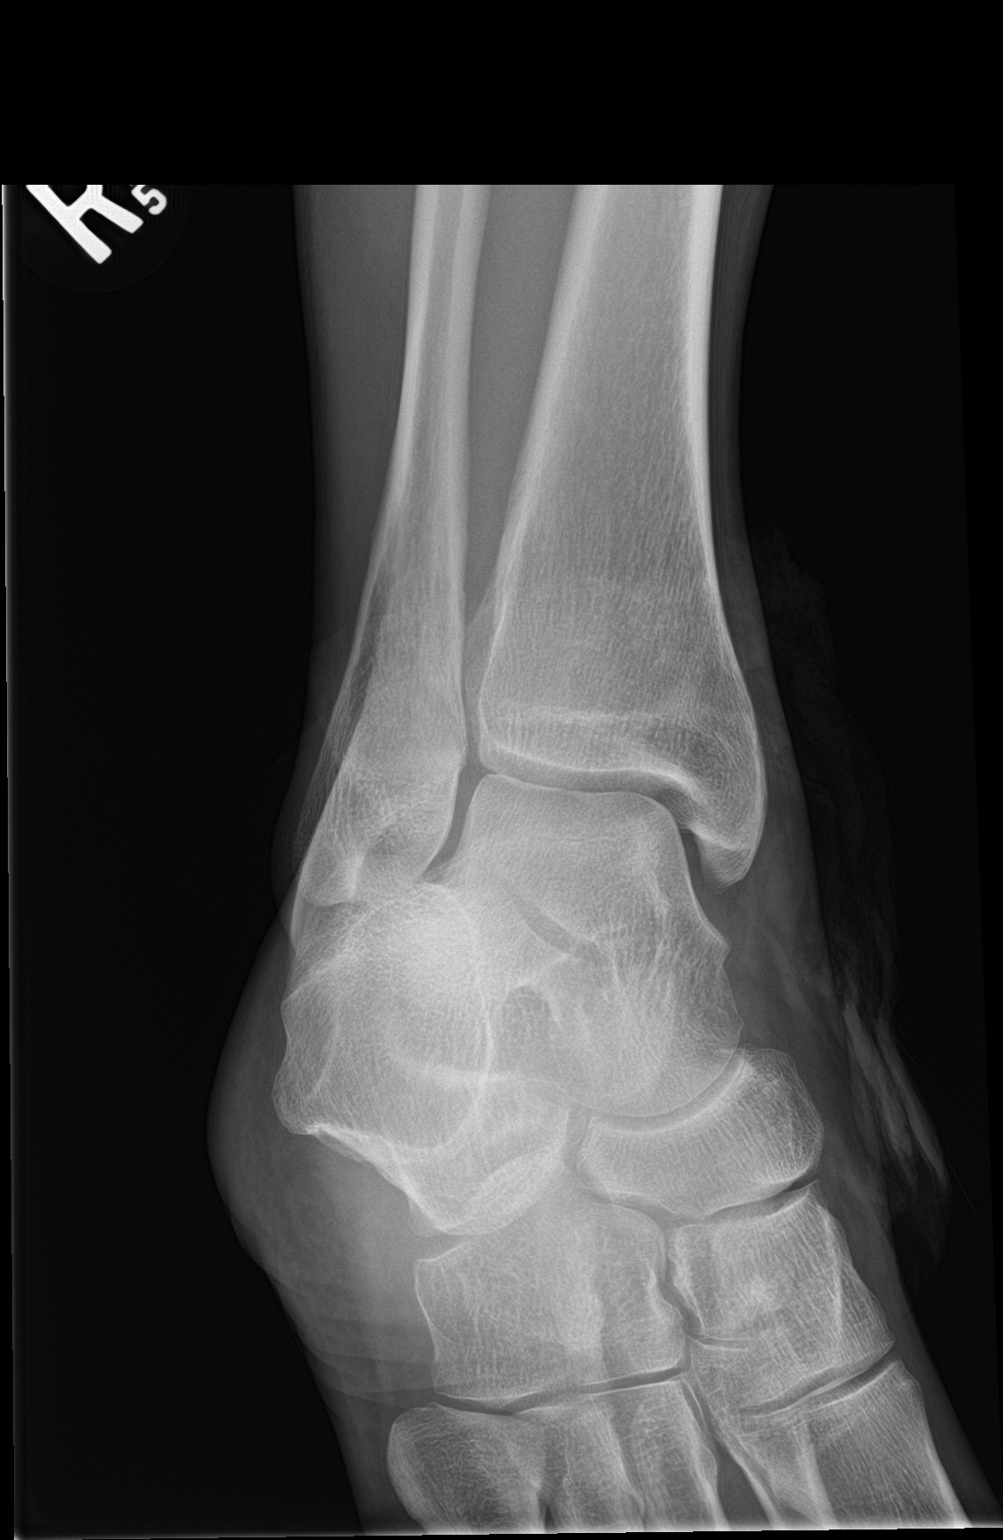

[ankle lat]
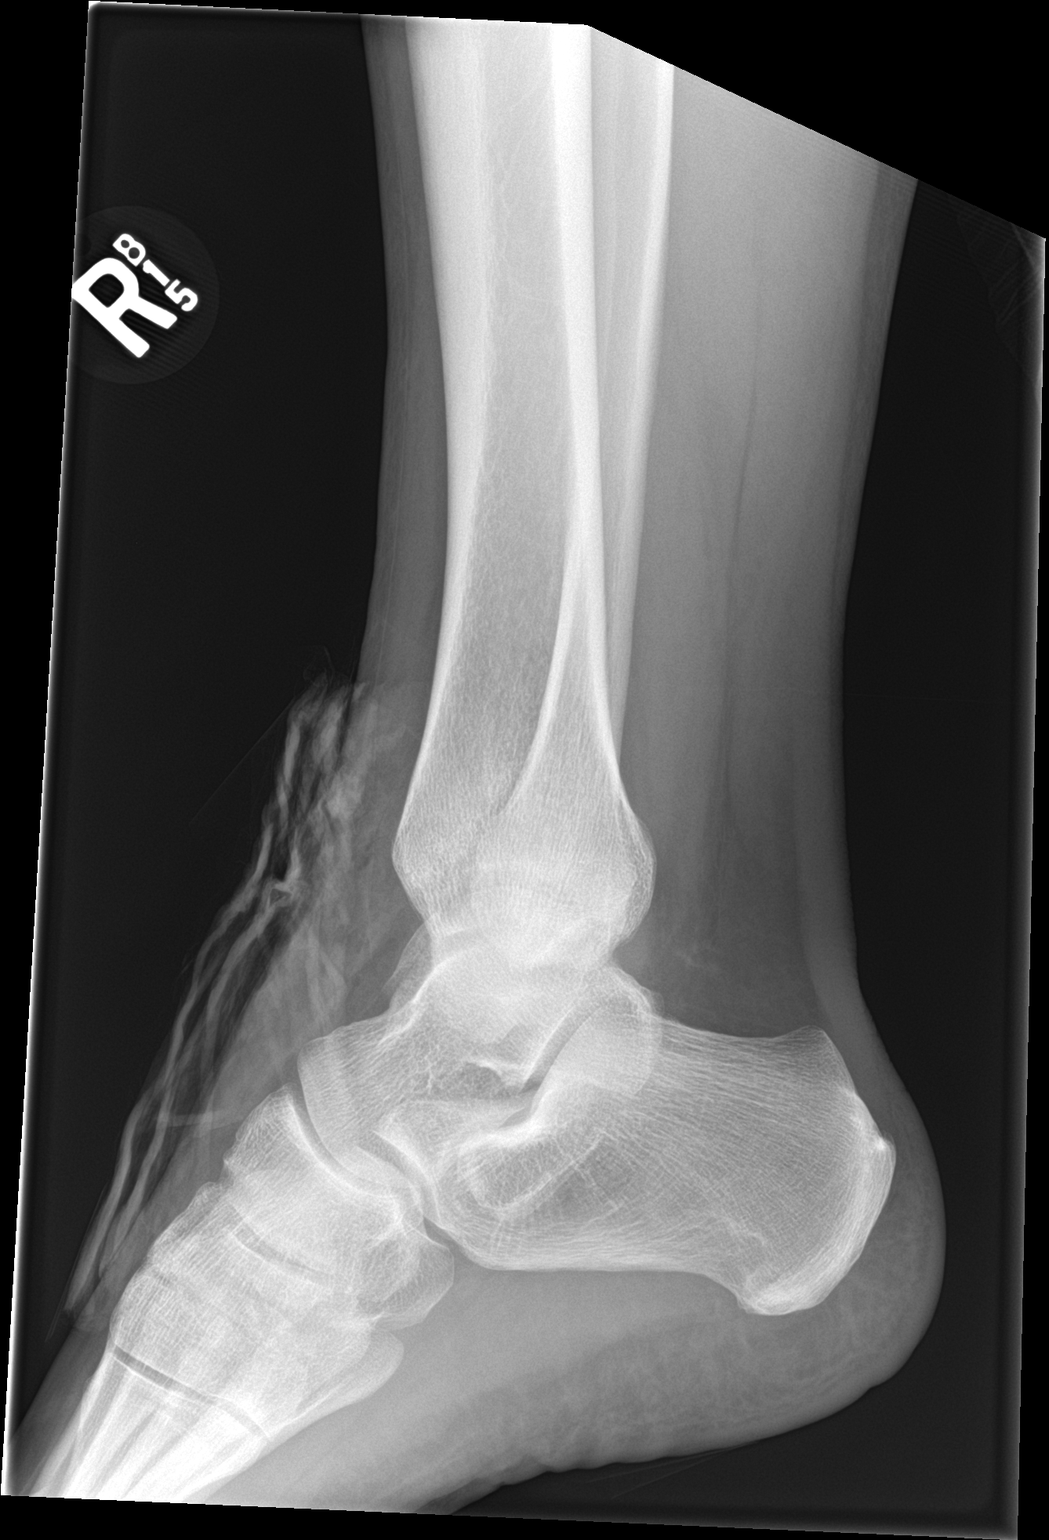

[3 of 3 positions shown; findings below may reference images not displayed]

FINDINGS: Some soft tissue deformity. Bandage artifact. No evidence of
fracture. No air/gas seen in any joint. No discernible radiopaque
foreign object.
IMPRESSION: Soft tissue injury. No evidence of fracture or discernible
radiopaque foreign object.
